# Patient Record
Sex: Male | Born: 2005 | Race: Black or African American | Hispanic: No | Marital: Single | Smoking: Never smoker
Health system: Southern US, Community
[De-identification: ages and names within clinical notes are randomized; demographics above are authoritative.]

---
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
## 2024-07-11 ENCOUNTER — Encounter: Payer: Self-pay | Admitting: Emergency Medicine

## 2024-07-11 ENCOUNTER — Emergency Department
Admission: EM | Admit: 2024-07-11 | Discharge: 2024-07-12 | Disposition: A | Discharge status: Home / Self Care | Attending: Emergency Medicine | Admitting: Emergency Medicine

## 2024-07-11 ENCOUNTER — Other Ambulatory Visit: Payer: Self-pay

## 2024-07-11 ENCOUNTER — Emergency Department

## 2024-07-11 DIAGNOSIS — N50812 Left testicular pain: Secondary | ICD-10-CM | POA: Diagnosis present

## 2024-07-11 DIAGNOSIS — I861 Scrotal varices: Secondary | ICD-10-CM | POA: Diagnosis not present

## 2024-07-11 LAB — URINALYSIS, ROUTINE W REFLEX MICROSCOPIC
Bilirubin Urine: NEGATIVE
Glucose, UA: NEGATIVE mg/dL
Hgb urine dipstick: NEGATIVE
Ketones, ur: NEGATIVE mg/dL
Leukocytes,Ua: NEGATIVE
Nitrite: NEGATIVE
Protein, ur: NEGATIVE mg/dL
Specific Gravity, Urine: 1.019 (ref 1.005–1.030)
pH: 6 (ref 5.0–8.0)

## 2024-07-11 LAB — CHLAMYDIA/NGC RT PCR (ARMC ONLY)
Chlamydia Tr: NOT DETECTED
N gonorrhoeae: NOT DETECTED

## 2024-07-11 MED ORDER — IBUPROFEN 800 MG PO TABS
800.0000 mg | ORAL_TABLET | ORAL | Status: AC
  Administered 2024-07-11: 800 mg via ORAL
  Filled 2024-07-11: qty 1

## 2024-07-11 NOTE — ED Triage Notes (Signed)
 Arrived pov for groin pain and swelling  Per pt the swelling started on the left side awhile ago and I have an appt to have it checked on Thurs, but the right side just started hurting about pta. Now it hurts anytime I move.

## 2024-07-11 NOTE — ED Provider Notes (Signed)
 [facility] Provider Note    Event Date/Time   First MD Initiated Contact with Patient 07/11/24 2017     (approximate)   History   Groin Pain   HPI  Nathan Vega is a 18 y.o. male reports no major medical history  He is here with both his mother and father.  Takes no medications, does take vitamins for weight lifting.  About a month ago started having some tenderness in his left testicle feels like a little bit of a full feeling, then tonight started feeling the same thing in the right testicle.  Reports it is an achy discomfort and sort of a full feeling almost felt like he had some cords or something that were a little swollen in the scrotum.  No fevers no chills no abdominal pain.  Denies pain in the groin areas no swelling in the groin areas.  No pain or discomfort with urination.  No penile problems.  No pain or burning with urination  Sexually active last few months ago  Patient relates that when he does heavy exertional weightlifting and weight training that seems to bring about his symptoms more so, and also when he is lifting heavy things at work     Physical Exam   Triage Vital Signs: ED Triage Vitals [07/11/24 1936]  Encounter Vitals Group     BP (!) 149/79     Girls Systolic BP Percentile      Girls Diastolic BP Percentile      Boys Systolic BP Percentile      Boys Diastolic BP Percentile      Pulse Rate (!) 58     Resp 18     Temp 97.6 F (36.4 C)     Temp Source Oral     SpO2 97 %     Weight 200 lb (90.7 kg)     Height 6' 4 (1.93 m)     Head Circumference      Peak Flow      Pain Score      Pain Loc      Pain Education      Exclude from Growth Chart     Most recent vital signs: Vitals:   07/11/24 1936 07/11/24 2100  BP: (!) 149/79 (!) 146/56  Pulse: (!) 58 (!) 126  Resp: 18   Temp: 97.6 F (36.4 C)   SpO2: 97% 94%     General: Awake, no distress.  Very pleasant parents at bedside.  Father stays in room  during exam CV:  Good peripheral perfusion.  Normal pulse heart rate approximately 60 Resp:  Normal effort.  Abd:  No distention.  Soft nontender nondistended throughout Other:  Antibiotics inspection the aspects of the scrotum appear normal.  Normal color.  No swelling.  The penis is circumcised and normal.  Nondirect.  He reports ports mild discomfort along the veins and spermatic cords very mild bilateral.  There is no obvious pain over the epididymis.  No masses.  Normal cremaster.  Does not appear to be any findings that would be highly suggestive of torsion.  Inguinal canals are normal and with Valsalva no masses noted in either canal   ED Results / Procedures / Treatments   Labs (all labs ordered are listed, but only abnormal results are displayed) Labs Reviewed  URINALYSIS, ROUTINE W REFLEX MICROSCOPIC - Abnormal; Notable for the following components:      Result Value   Color, Urine YELLOW (*)  APPearance HAZY (*)    All other components within normal limits  CHLAMYDIA/NGC RT PCR [facility] ONLY)               EKG     RADIOLOGY  US  SCROTUM W/DOPPLER Result Date: 07/11/2024 CLINICAL DATA:  Bilateral testicular pain EXAM: SCROTAL ULTRASOUND DOPPLER ULTRASOUND OF THE TESTICLES TECHNIQUE: Complete ultrasound examination of the testicles, epididymis, and other scrotal structures was performed. Color and spectral Doppler ultrasound were also utilized to evaluate blood flow to the testicles. COMPARISON:  None Available. FINDINGS: Right testicle Measurements: 3.5 x 2.4 x 2.6 cm. No mass or microlithiasis visualized. Left testicle Measurements: 3.4 x 2.2 x 2.9 cm. No mass or microlithiasis visualized. Right epididymis:  Normal in size and appearance. Left epididymis:  Normal in size and appearance. Hydrocele:  None visualized. Varicocele:  Mild bilateral varicoceles. Pulsed Doppler interrogation of both testes demonstrates normal low resistance arterial and venous waveforms bilaterally.  IMPRESSION: No testicular abnormality or evidence of torsion. Mild bilateral varicoceles. Electronically Signed   By: Franky Crease M.D.   On: 07/11/2024 21:11    No evidence of torsion.  Mild bilateral varicocele   PROCEDURES:  Critical Care performed: No  Procedures   MEDICATIONS ORDERED IN ED: Medications  ibuprofen (ADVIL) tablet 800 mg (800 mg Oral Given 07/11/24 2043)     IMPRESSION / MDM / ASSESSMENT AND PLAN / ED COURSE  I reviewed the triage vital signs and the nursing notes.                              Very reassuring exam no associated abdominal pain.  Resting comfortably.  Pain alleviated by ritlecitinib resting and laying, worsened by standing in particular lifting heavy things at work or when doing heavy weight lifting which she enjoys.  Examination demonstrates no evidence of hernias, inguinal hernia, inguinal mass or concerning findings for high risk of torsion.  I do think based on his symptoms the association with worsening with heavy lifting as well as the nature of symptoms described vascular congestion, epididymitis, orchitis or other causes would be higher on the differential.  He does not have any associated systemic symptoms.  No fevers denies any dysuria.  No trouble urinating.  Sexually active but lasts at least a few months ago without urethral discharge.  Will send gonorrhea chlamydia urine test but at this juncture I find no obvious findings suggest high risk for STI as cause.  No urethral symptoms  Patient's presentation is most consistent with acute complicated illness / injury requiring diagnostic workup.   ----------------------------------------- 11:55 PM on 07/11/2024 -----------------------------------------   Return precautions and treatment recommendations and follow-up discussed with the patient who is agreeable with the plan.  Educated patient's on recommendation to avoid strenuous heavy weight lifting, use of NSAIDs, follow-up with  urology.  Return precautions advised       FINAL CLINICAL IMPRESSION(S) / ED DIAGNOSES   Final diagnoses:  Varicocele     Rx / DC Orders   ED Discharge Orders     None        Note:  This document was prepared using Dragon voice recognition software and may include unintentional dictation errors.   Dicky Anes, MD 07/11/24 [phone]

## 2024-07-11 NOTE — ED Notes (Signed)
 [place] in room

## 2024-07-11 NOTE — ED Notes (Signed)
 Patient provided with urine cup and water. Patient verbalizes understanding of need for sample.

## 2024-07-11 NOTE — Discharge Instructions (Signed)
 Recommend use ibuprofen over-the-counter as directed on packaging for the next few days to help with decreasing inflammation.  Additionally I recommend you avoid heavy strenuous weightlifting which may aggravate this.  Follow-up with your primary care or with urology.  Return to the ER right away if you develop fever, abdominal pain vomiting swelling in the groin, inability urinate painful testicle scrotal swelling or other concerns.

## 2024-07-15 ENCOUNTER — Ambulatory Visit: Admitting: Family Medicine

## 2024-07-15 ENCOUNTER — Encounter: Payer: Self-pay | Admitting: Family Medicine

## 2024-07-15 VITALS — BP 136/74 | HR 62 | Ht 76.0 in | Wt 206.2 lb

## 2024-07-15 DIAGNOSIS — Z23 Encounter for immunization: Secondary | ICD-10-CM | POA: Diagnosis not present

## 2024-07-15 DIAGNOSIS — Z0000 Encounter for general adult medical examination without abnormal findings: Secondary | ICD-10-CM

## 2024-07-15 DIAGNOSIS — I861 Scrotal varices: Secondary | ICD-10-CM | POA: Diagnosis not present

## 2024-07-15 NOTE — Progress Notes (Signed)
 Complete physical exam  Patient: Nathan Vega   DOB: 2006-09-05   17 y.o. Male  MRN: [id number]  Subjective:    Chief Complaint  Patient presents with   Establish Care    Patient is here to establish care     Nathan Vega is a 18 y.o. male who presents today for a complete physical exam. He reports consuming a general diet. The patient has a physically strenuous job, but has no regular exercise apart from work.  He generally feels fairly well. He reports sleeping fairly well. He does not have additional problems to discuss today.   Discussed the use of AI scribe software for clinical note transcription with the patient, who gave verbal consent to proceed.  History of Present Illness Nathan Vega is an 18 year old male who presents for follow-up after an ER visit for testicular discomfort.  He was diagnosed with a varicocele following an ER visit due to testicular discomfort. He reports that an examination and US  of the testicles were conducted in the ER. He currently has no testicular swelling, urinary symptoms, or hematuria. He reports discomfort when squatting and is unsure if it is related to his pelvic region being sore or tight.  He describes his fingertips as feeling 'real cold.' No pressure in the groin or testicular area, except for occasional pressure during workouts.  He is physically active, playing basketball three times a week and working out almost daily, lifting heavy weights both at the gym and at his job with [employer]. He has been taking ibuprofen for pain management, although he reports not feeling significant pain even before starting the medication.  He is curious about his testosterone levels, although he denies any symptoms such as erectile dysfunction or decreased libido. He is covered by his father's insurance and is not currently experiencing any symptoms that would necessitate further investigation into testosterone levels.   Most recent fall risk  assessment:    07/15/2024    1:25 PM  Fall Risk   Falls in the past year? 0  Number falls in past yr: 0  Injury with Fall? 0  Risk for fall due to : No Fall Risks  Follow up Falls evaluation completed     Most recent depression screenings:    07/15/2024    2:02 PM  PHQ 2/9 Scores  PHQ - 2 Score 0  PHQ- 9 Score 5    Vision:Within last year     Patient Care Team: Angie Piercey K, MD as PCP - General (Family Medicine)   Outpatient Medications Prior to Visit  Medication Sig   cetirizine (ZYRTEC) 10 MG tablet Take 10 mg by mouth at bedtime. (Patient taking differently: Take 10 mg by mouth as needed.)   ibuprofen (ADVIL) 200 MG tablet Take 400 mg by mouth every 6 (six) hours as needed for mild pain (pain score 1-3).   doxycycline  (VIBRAMYCIN ) 100 MG capsule Take 1 capsule (100 mg total) by mouth 2 (two) times daily. (Patient not taking: Reported on 07/15/2024)   fluticasone (FLONASE) 50 MCG/ACT nasal spray SMARTSIG:1 Spray(s) Both Nares Daily PRN (Patient not taking: Reported on 07/15/2024)   ipratropium (ATROVENT ) 0.03 % nasal spray Place 2 sprays into both nostrils every 12 (twelve) hours. (Patient not taking: Reported on 07/15/2024)   predniSONE  (DELTASONE ) 10 MG tablet Take 6 tablets  today, on day 2 take 5 tablets, day 3 take 4 tablets, day 4 take 3 tablets, day 5 take  2 tablets and  1 tablet the last day (Patient not taking: Reported on 07/15/2024)   No facility-administered medications prior to visit.    Review of Systems  All other systems reviewed and are negative.         Objective:     BP 136/74   Pulse 62   Ht 6' 4 (1.93 m)   Wt 206 lb 4 oz (93.6 kg)   SpO2 99%   BMI 25.11 kg/m     Physical Exam Vitals and nursing note reviewed.  Constitutional:      Appearance: Normal appearance.  HENT:     Head: Normocephalic.     Right Ear: External ear normal.     Left Ear: External ear normal.  Eyes:     Conjunctiva/sclera: Conjunctivae normal.   Cardiovascular:     Rate and Rhythm: Normal rate.  Pulmonary:     Effort: Pulmonary effort is normal. No respiratory distress.  Abdominal:     Palpations: Abdomen is soft.  Genitourinary:    Comments: Deferred Musculoskeletal:        General: Normal range of motion.  Skin:    General: Skin is warm.  Neurological:     Mental Status: He is alert and oriented to person, place, and time.  Psychiatric:        Mood and Affect: Mood normal.      No results found for any visits on 07/15/24.      Assessment & Plan:    Routine Health Maintenance and Physical Exam  Immunization History  Administered Date(s) Administered   Influenza, Seasonal, Injecte, Preservative Fre 07/15/2024   PFIZER(Purple Top)SARS-COV-2 Vaccination 03/07/2020, 03/28/2020    Health Maintenance  Topic Date Due   Hepatitis B Vaccines 19-59 Average Risk (1 of 3 - 3-dose series) Never done   DTaP/Tdap/Td (1 - Tdap) Never done   HPV VACCINES (1 - Male 3-dose series) Never done   HIV Screening  Never done   Meningococcal B Vaccine (1 of 2 - Standard) Never done   Hepatitis C Screening  Never done   COVID-19 Vaccine (3 - 2025-26 season) 07/31/2025 (Originally 06/14/2024)   Influenza Vaccine  Completed   Pneumococcal Vaccine  Aged Out    Discussed health benefits of physical activity, and encouraged him to engage in regular exercise appropriate for his age and condition.  Problem List Items Addressed This Visit   None Visit Diagnoses       Annual physical exam    -  Primary   Relevant Orders   Urinalysis, Routine w reflex microscopic   CBC with Differential   Comprehensive Metabolic Panel (CMET)   Lipid panel     Bilateral varicoceles         Encounter for immunization       Relevant Orders   Flu vaccine trivalent PF, 6mos and older(Flulaval,Afluria,Fluarix,Fluzone) (Completed)     Assessment and Plan Assessment & Plan Varicocele Varicocele likely due to heavy lifting. Symptoms resolved but risk of  recurrence with continued heavy lifting. Mild discomfort during squatting possibly due to pelvic tightness. - Advise wearing supportive underwear to prevent recurrence. - Refer to speclialist if symptoms return or swelling occurs.   No follow-ups on file.     Vinary K Zanetta Dehaan, MD

## 2024-07-16 ENCOUNTER — Ambulatory Visit: Payer: Self-pay | Admitting: Family Medicine

## 2024-07-16 LAB — CBC WITH DIFFERENTIAL/PLATELET
Basophils Absolute: 0 x10E3/uL (ref 0.0–0.2)
Basos: 1 %
EOS (ABSOLUTE): 0 x10E3/uL (ref 0.0–0.4)
Eos: 1 %
Hematocrit: 50.3 % (ref 37.5–51.0)
Hemoglobin: 15.8 g/dL (ref 13.0–17.7)
Immature Grans (Abs): 0 x10E3/uL (ref 0.0–0.1)
Immature Granulocytes: 0 %
Lymphocytes Absolute: 1.5 x10E3/uL (ref 0.7–3.1)
Lymphs: 41 %
MCH: 28 pg (ref 26.6–33.0)
MCHC: 31.4 g/dL — ABNORMAL LOW (ref 31.5–35.7)
MCV: 89 fL (ref 79–97)
Monocytes Absolute: 0.3 x10E3/uL (ref 0.1–0.9)
Monocytes: 7 %
Neutrophils Absolute: 1.8 x10E3/uL (ref 1.4–7.0)
Neutrophils: 50 %
Platelets: 203 x10E3/uL (ref 150–450)
RBC: 5.65 x10E6/uL (ref 4.14–5.80)
RDW: 12.6 % (ref 11.6–15.4)
WBC: 3.6 x10E3/uL (ref 3.4–10.8)

## 2024-07-16 LAB — LIPID PANEL
Chol/HDL Ratio: 3 ratio (ref 0.0–5.0)
Cholesterol, Total: 212 mg/dL — ABNORMAL HIGH (ref 100–169)
HDL: 70 mg/dL (ref >39)
LDL Chol Calc (NIH): 132 mg/dL — ABNORMAL HIGH (ref 0–109)
Triglycerides: 55 mg/dL (ref 0–89)
VLDL Cholesterol Cal: 10 mg/dL (ref 5–40)

## 2024-07-16 LAB — URINALYSIS, ROUTINE W REFLEX MICROSCOPIC
Bilirubin, UA: NEGATIVE
Glucose, UA: NEGATIVE
Ketones, UA: NEGATIVE
Leukocytes,UA: NEGATIVE
Nitrite, UA: NEGATIVE
Protein,UA: NEGATIVE
RBC, UA: NEGATIVE
Specific Gravity, UA: 1.008 (ref 1.005–1.030)
Urobilinogen, Ur: 0.2 mg/dL (ref 0.2–1.0)
pH, UA: 7.5 (ref 5.0–7.5)

## 2024-07-16 LAB — COMPREHENSIVE METABOLIC PANEL WITH GFR
ALT: 26 IU/L (ref 0–44)
AST: 24 IU/L (ref 0–40)
Albumin: 4.8 g/dL (ref 4.3–5.2)
Alkaline Phosphatase: 77 IU/L (ref 51–125)
BUN/Creatinine Ratio: 12 (ref 9–20)
BUN: 13 mg/dL (ref 6–20)
Bilirubin Total: 0.4 mg/dL (ref 0.0–1.2)
CO2: 24 mmol/L (ref 20–29)
Calcium: 10.1 mg/dL (ref 8.7–10.2)
Chloride: 98 mmol/L (ref 96–106)
Creatinine, Ser: 1.1 mg/dL (ref 0.76–1.27)
Globulin, Total: 3.1 g/dL (ref 1.5–4.5)
Glucose: 84 mg/dL (ref 70–99)
Potassium: 4.8 mmol/L (ref 3.5–5.2)
Sodium: 138 mmol/L (ref 134–144)
Total Protein: 7.9 g/dL (ref 6.0–8.5)
eGFR: 100 mL/min/1.73 (ref >59)

## 2024-08-25 ENCOUNTER — Encounter: Payer: Self-pay | Admitting: Family Medicine

## 2024-08-25 NOTE — Telephone Encounter (Signed)
 Please review patient's message:

## 2024-08-26 ENCOUNTER — Other Ambulatory Visit: Payer: Self-pay | Admitting: Family Medicine

## 2024-08-26 DIAGNOSIS — R6250 Unspecified lack of expected normal physiological development in childhood: Secondary | ICD-10-CM

## 2024-08-26 NOTE — Telephone Encounter (Signed)
 Please review patient's response.

## 2024-09-06 ENCOUNTER — Telehealth: Payer: Self-pay

## 2024-09-06 NOTE — Telephone Encounter (Unsigned)
 Copied from CRM [id number]. Topic: Referral - Status >> Sep 06, 2024  1:29 PM Larissa RAMAN wrote: Reason for CRM: [facility] states they received a endocrinologist referral for patient and  requesting to have diagnosis clarification, detailed notes, and labs sent.  Callback # [id number]

## 2024-09-16 ENCOUNTER — Encounter: Payer: Self-pay | Admitting: Family Medicine

## 2024-09-16 ENCOUNTER — Ambulatory Visit: Admitting: Family Medicine

## 2024-09-16 ENCOUNTER — Encounter: Admitting: Family Medicine

## 2024-09-16 VITALS — BP 112/56 | HR 77 | Ht 76.03 in | Wt 212.0 lb

## 2024-09-16 DIAGNOSIS — M654 Radial styloid tenosynovitis [de Quervain]: Secondary | ICD-10-CM | POA: Insufficient documentation

## 2024-09-16 MED ORDER — MELOXICAM 15 MG PO TABS: 15.0000 mg | ORAL_TABLET | Freq: Every day | ORAL | 0 refills | Status: AC

## 2024-09-16 NOTE — Assessment & Plan Note (Signed)
 History of Present Illness Nathan Vega is a 18 year old male who presents with two months of left thumb and wrist pain.  Left wrist and thumb pain - Pain in the left thumb and wrist region for approximately two months, onset after performing pushups on knuckles - Pain exacerbated by wrist extension, associated with a clicking sensation and subsequent blockage and pain - Wrist described as 'kind of loose' compared to the contralateral side - Pain sometimes severe enough to wake him from sleep - No history of bruising or previous injuries to the left wrist - No swelling observed - No numbness or tingling in the thumb or hand, though occasional mild tingling is present  Functional impact and activity modification - Right-handed - Works at [employer] with frequent heavy lifting; wrist pain and weakness impair ability to perform work tasks - Currently taking plumbing courses; 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 PALPATION: Significant tenderness at the left radial styloid. Left first CMC and MCP non-tender. SPECIAL TESTS: Positive Finkelstein's test on the left. NEUROVASCULAR: Sensation and motor function intact in radial, ulnar, and median nerve distributions bilaterally.  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 Positive Finkelstein's test.  - Prescribed meloxicam daily for two weeks, then as needed. Take with food. - Advised wearing a  thumb spica brace during wakeful hours to prevent further inflammation. - Provided exercises to improve flexibility and strength, to start in the second week. - Discussed potential corticosteroid injection if symptoms persist. - Advised on work accommodations to minimize hand use. - Sent medication prescription to pharmacy. - Provided brace for use.

## 2024-09-16 NOTE — Patient Instructions (Signed)
 VISIT SUMMARY:  You came in today because of pain in your left thumb and wrist that has been bothering you for about two months. The pain started after doing pushups on your knuckles and has been affecting your work and daily activities.  YOUR PLAN:  LEFT RADIAL STYLOID TENOSYNOVITIS (DE QUERVAIN'S DISEASE): You have inflammation of the tendon sheath around your left wrist due to repetitive motion. -Take meloxicam daily for two weeks, then as needed for pain. -Wear a thumb spica brace during the day to prevent further inflammation x 2 weeks then wean from brace. -Start doing the exercises provided to improve flexibility and strength in the second week. -If your symptoms do not improve, we may consider a corticosteroid injection. Contact us  if needing this. -Try to minimize the use of your hand at work to avoid further strain. -Your medication prescription has been sent to the pharmacy. -You have been provided with a brace to use.

## 2024-09-16 NOTE — Progress Notes (Signed)
 Primary Care / Sports Medicine Office Visit  Patient Information:  Patient ID: Nathan Vega, male DOB: 09-09-2006 Age: 18 y.o. MRN: [id number]   Nathan Vega is a pleasant 18 y.o. male presenting with the following:  Chief Complaint  Patient presents with   Hand Pain    Left hand pain x 2 months. Patients pian is near the thumb and wrist. He was doing push ups when the pain started. He states the pain is a strong ache and has clicking in thumb. He has not taken any medication for the pain. Pain level is 3/10.    Vitals:   09/16/24 1002  BP: (!) 112/56  Pulse: 77  SpO2: 96%   Vitals:   09/16/24 1002  Weight: 212 lb (96.2 kg)  Height: 6' 4.03 (1.931 m)   Body mass index is 25.79 kg/m.  No results found.   Discussed the use of AI scribe software for clinical note transcription with the patient, who gave verbal consent to proceed.   Independent interpretation of notes and tests performed by another provider:   None  Procedures performed:   None  Pertinent History, Exam, Impression, and Recommendations:   Problem List Items Addressed This Visit     Radial styloid tenosynovitis of left hand - Primary   History of Present Illness Nathan Vega is a 18 year old male who presents with two months of left thumb and wrist pain.  Left wrist and thumb pain - Pain in the left thumb and wrist region for approximately two months, onset after performing pushups on knuckles - Pain exacerbated by wrist extension, associated with a clicking sensation and subsequent blockage and pain - Wrist described as 'kind of loose' compared to the contralateral side - Pain sometimes severe enough to wake him from sleep - No history of bruising or previous injuries to the left wrist - No swelling observed - No numbness or tingling in the thumb or hand, though occasional mild tingling is present  Functional impact and activity modification - Right-handed - Works at  [employer] with frequent heavy lifting; wrist pain and weakness impair ability to perform work tasks - Currently taking plumbing courses; 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 PALPATION: Significant tenderness at the left radial styloid. Left first CMC and MCP non-tender. SPECIAL TESTS: Positive Finkelstein's test on the left. NEUROVASCULAR: Sensation and motor function intact in radial, ulnar, and median nerve distributions bilaterally.  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 Positive Finkelstein's test.  - Prescribed meloxicam daily for two weeks, then as needed. Take with food. - Advised wearing a thumb spica brace during wakeful hours to prevent further inflammation. - Provided exercises to improve flexibility and strength, to start in the second week. - Discussed potential corticosteroid injection if symptoms persist. - Advised on work accommodations to minimize hand use. - Sent medication prescription to pharmacy. - Provided brace for use.      Relevant Medications   meloxicam (MOBIC) 15 MG tablet     Orders &  Medications Medications:  Meds ordered this encounter  Medications   meloxicam (MOBIC) 15 MG tablet    Sig: Take 1 tablet (15 mg total) by mouth daily. X1-2 weeks then daily PRN    Dispense:  30 tablet    Refill:  0   No orders of the defined types were placed in this encounter.    No follow-ups on file.     Selinda JINNY Ku, MD, [facility]   Primary Care Sports Medicine Primary Care and Sports Medicine at [facility]

## 2024-09-20 ENCOUNTER — Encounter: Admitting: Family Medicine

## 2024-10-04 ENCOUNTER — Telehealth: Payer: Self-pay | Admitting: Family Medicine

## 2024-10-04 NOTE — Telephone Encounter (Signed)
 Good morning team,  Could you assist with this patient's referral? Please let us  know if additional information is needed

## 2024-10-04 NOTE — Telephone Encounter (Signed)
 Copied from CRM #[id number]. Topic: Referral - Status >> Oct 04, 2024  8:56 AM Frederich PARAS wrote: Reason for CRM: : Tameka from [facility] states they received a endocrinologist referral for patient and  requesting to have diagnosis clarification, detailed notes, and labs sent. They need more notes as to why the referral was sent and she would like to know before it closes if there is a concern for endocrine  this referrral will be closed out on Friday if there is no response.  Callback # [phone]

## 2024-10-04 NOTE — Telephone Encounter (Signed)
 Thank you for the quick response. Please be advised that Dr. Sol is out of the office until January 5th, I will forward this to him as well.

## 2024-10-04 NOTE — Telephone Encounter (Signed)
 Pt is interested in getting taller and was referred to Endo.
